# Patient Record
Sex: Female | Born: 1995 | Race: Black or African American | Hispanic: No | Marital: Single | Smoking: Former smoker
Health system: Southern US, Community
[De-identification: ages and names within clinical notes are randomized; demographics above are authoritative.]

## PROBLEM LIST (undated history)

## (undated) HISTORY — PX: URETHRA SURGERY: SHX824

---

## 2016-01-20 ENCOUNTER — Emergency Department (HOSPITAL_BASED_OUTPATIENT_CLINIC_OR_DEPARTMENT_OTHER): Payer: BLUE CROSS/BLUE SHIELD

## 2016-01-20 ENCOUNTER — Emergency Department (HOSPITAL_BASED_OUTPATIENT_CLINIC_OR_DEPARTMENT_OTHER)
Admission: EM | Admit: 2016-01-20 | Discharge: 2016-01-20 | Disposition: A | Discharge status: Home / Self Care | Payer: BLUE CROSS/BLUE SHIELD | Attending: Emergency Medicine | Admitting: Emergency Medicine

## 2016-01-20 ENCOUNTER — Encounter (HOSPITAL_BASED_OUTPATIENT_CLINIC_OR_DEPARTMENT_OTHER): Payer: Self-pay | Admitting: *Deleted

## 2016-01-20 DIAGNOSIS — Z87891 Personal history of nicotine dependence: Secondary | ICD-10-CM | POA: Diagnosis not present

## 2016-01-20 DIAGNOSIS — S61316A Laceration without foreign body of right little finger with damage to nail, initial encounter: Secondary | ICD-10-CM | POA: Insufficient documentation

## 2016-01-20 DIAGNOSIS — W230XXA Caught, crushed, jammed, or pinched between moving objects, initial encounter: Secondary | ICD-10-CM | POA: Diagnosis not present

## 2016-01-20 DIAGNOSIS — S6991XA Unspecified injury of right wrist, hand and finger(s), initial encounter: Secondary | ICD-10-CM

## 2016-01-20 DIAGNOSIS — Y998 Other external cause status: Secondary | ICD-10-CM | POA: Insufficient documentation

## 2016-01-20 DIAGNOSIS — Y9289 Other specified places as the place of occurrence of the external cause: Secondary | ICD-10-CM | POA: Insufficient documentation

## 2016-01-20 DIAGNOSIS — Y9389 Activity, other specified: Secondary | ICD-10-CM | POA: Diagnosis not present

## 2016-01-20 DIAGNOSIS — S60051A Contusion of right little finger without damage to nail, initial encounter: Secondary | ICD-10-CM | POA: Insufficient documentation

## 2016-01-20 MED ORDER — IBUPROFEN 400 MG PO TABS
600.0000 mg | ORAL_TABLET | Freq: Once | ORAL | Status: AC
  Administered 2016-01-20: 600 mg via ORAL
  Filled 2016-01-20: qty 1

## 2016-01-20 NOTE — Discharge Instructions (Signed)
You do not have a broken finger on your x-ray.  Continue supportive care with ice, motrin and tylenol for pain.  Follow-up with PCP in 1-2 weeks.  Return for fever, worsening pain or swelling, pus drainage, or any other symptoms concerning to you.

## 2016-01-20 NOTE — ED Notes (Signed)
Reports she shut car door on right pinky yesterday

## 2016-01-20 NOTE — ED Provider Notes (Signed)
CSN: [id number]     Arrival date & time 01/20/16  4098 History   First MD Initiated Contact with Patient 01/20/16 [phone]     Chief Complaint  Patient presents with  . Finger Injury     (Consider location/radiation/quality/duration/timing/severity/associated sxs/prior Treatment) HPI 20 year old female who presents with right fifth digit injury. She is otherwise healthy. States that she accidentally closed the car door on her right small finger yesterday and has had pain to the distal portion of her finger. Has been taking Tylenol for pain. Denies any numbness or weakness. Denies any other injuries. Did sustain a small laceration, and states that her bleeding is controlled and that her tetanus is up-to-date. History reviewed. No pertinent past medical history. Past Surgical History  Procedure Laterality Date  . Urethra surgery     No family history on file. Social History  Substance Use Topics  . Smoking status: Former Smoker    Types: Cigarettes  . Smokeless tobacco: Never Used  . Alcohol Use: No   OB History    No data available     Review of Systems  Constitutional: Negative for fever.  Skin: Positive for wound.  Neurological: Negative for weakness and numbness.  Hematological: Does not bruise/bleed easily.  All other systems reviewed and are negative.     Allergies  Hydrocodone  Home Medications   Prior to Admission medications   Not on File   BP 112/75 mmHg  Pulse 67  Temp(Src) 98.9 F (37.2 C) (Oral)  Resp 18  Ht  (1.6 m)  Wt 149 lb (67.586 kg)  BMI 26.40 kg/m2  SpO2 100% Physical Exam Physical Exam  Nursing note and vitals reviewed. Constitutional: Well developed, well nourished, non-toxic, and in no acute distress Head: Normocephalic and atraumatic.  Mouth/Throat: Moist mucous membranes Neck:  Neck supple.  Cardiovascular: +2 radial pulse, normal distal capillary refill Pulmonary/Chest: Effort normal Abdominal: Soft.  Non-distended. Musculoskeletal: mild swelling and erythema involving the distal aspect of the right 5th digit. brusing to the pad of the 5th digit. Normal DIP and PIP flexion/extension. No swelling of joints of 5th digit. Small superficial laceration just proximal to nail. Neurological: Alert, no facial droop, fluent speech, sensation to light touch in tact in right hand Skin: Skin is warm and dry.  Psychiatric: Cooperative  ED Course  Procedures (including critical care time) Labs Review Labs Reviewed - No data to display  Imaging Review Dg Finger Little Right  01/20/2016  CLINICAL DATA:  Finger caught in car door EXAM: RIGHT FIFTH FINGER 2+V COMPARISON:  None. FINDINGS: Frontal, oblique, and lateral views were obtained. There is no demonstrable fracture or dislocation. The joint spaces appear normal. No erosive change. No soft tissue lesion identified radiographically. No apparent radiopaque foreign body. IMPRESSION: No fracture or dislocation. No appreciable arthropathy or radiopaque foreign body. Electronically Signed   By: Bretta Bang III M.D.   On: 01/20/2016 10:18   I have personally reviewed and evaluated these images and lab results as part of my medical decision-making.   EKG Interpretation None      MDM   Final diagnoses:  Injury of fifth finger, right, initial encounter    Presenting with right fifth digit injury after closing car door on her finger yesterday. she is well-appearing. Neurovascularly intact. X-ray without fracture. Intact flexors and extensors. She will continue supportive care for home. Strict return and follow-up instructions reviewed. She expressed understanding of all discharge instructions and felt comfortable with the plan of care.  Tammy Guise, MD 01/20/16 1048

## 2017-05-06 IMAGING — CR DG FINGER LITTLE 2+V*R*
3 series · 3 of 3 positions shown · non-contrast
Comparison: None.

CLINICAL DATA: Finger caught in car door

EXAM:
RIGHT FIFTH FINGER 2+V

[x finger pa right]
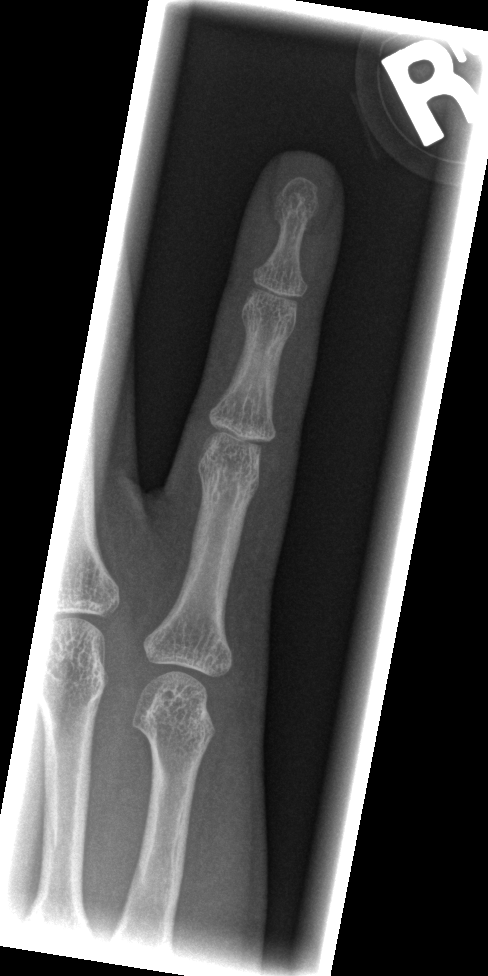

[x finger obl. right]
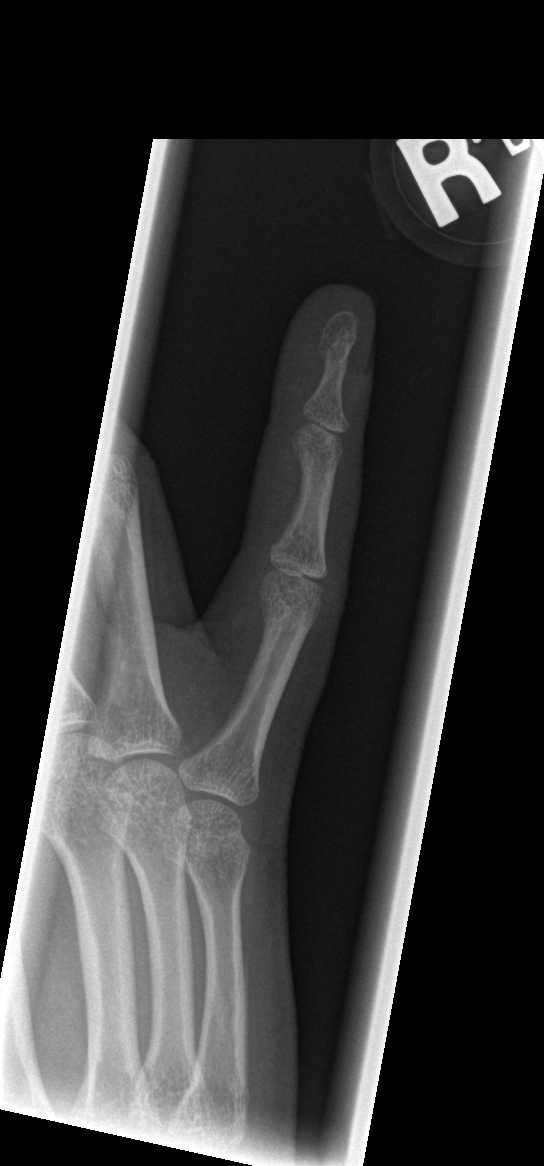

[x finger lateral right]
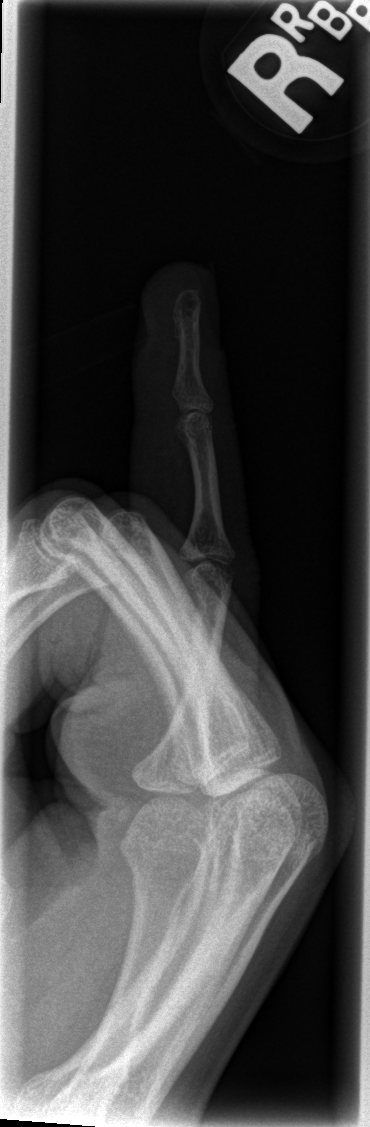

[3 of 3 positions shown; findings below may reference images not displayed]

FINDINGS: Frontal, oblique, and lateral views were obtained. There is no
demonstrable fracture or dislocation. The joint spaces appear
normal. No erosive change. No soft tissue lesion identified
radiographically. No apparent radiopaque foreign body.
IMPRESSION: No fracture or dislocation. No appreciable arthropathy or radiopaque
foreign body.
